# Patient Record
Sex: Female | Born: 1980 | Race: White | Hispanic: No | Smoking: Never smoker
Health system: Southern US, Community
[De-identification: ages and names within clinical notes are randomized; demographics above are authoritative.]

## PROBLEM LIST (undated history)

## (undated) DIAGNOSIS — I10 Essential (primary) hypertension: Secondary | ICD-10-CM

---

## 2013-03-05 ENCOUNTER — Emergency Department (HOSPITAL_COMMUNITY): Payer: BC Managed Care – PPO

## 2013-03-05 ENCOUNTER — Emergency Department (HOSPITAL_COMMUNITY)
Admission: EM | Admit: 2013-03-05 | Discharge: 2013-03-05 | Disposition: A | Discharge status: Home / Self Care | Payer: BC Managed Care – PPO | Attending: Emergency Medicine | Admitting: Emergency Medicine

## 2013-03-05 ENCOUNTER — Encounter (HOSPITAL_COMMUNITY): Payer: Self-pay | Admitting: Emergency Medicine

## 2013-03-05 DIAGNOSIS — R6889 Other general symptoms and signs: Secondary | ICD-10-CM | POA: Insufficient documentation

## 2013-03-05 DIAGNOSIS — R0989 Other specified symptoms and signs involving the circulatory and respiratory systems: Secondary | ICD-10-CM

## 2013-03-05 DIAGNOSIS — IMO0002 Reserved for concepts with insufficient information to code with codable children: Secondary | ICD-10-CM | POA: Insufficient documentation

## 2013-03-05 DIAGNOSIS — Z79899 Other long term (current) drug therapy: Secondary | ICD-10-CM | POA: Insufficient documentation

## 2013-03-05 DIAGNOSIS — Z888 Allergy status to other drugs, medicaments and biological substances status: Secondary | ICD-10-CM | POA: Insufficient documentation

## 2013-03-05 DIAGNOSIS — R110 Nausea: Secondary | ICD-10-CM | POA: Insufficient documentation

## 2013-03-05 DIAGNOSIS — I10 Essential (primary) hypertension: Secondary | ICD-10-CM

## 2013-03-05 HISTORY — DX: Essential (primary) hypertension: I10

## 2013-03-05 MED ORDER — PREDNISONE 20 MG PO TABS: ORAL_TABLET | ORAL | Status: AC

## 2013-03-05 MED ORDER — PREDNISONE 20 MG PO TABS
60.0000 mg | ORAL_TABLET | Freq: Once | ORAL | Status: AC
  Administered 2013-03-05: 60 mg via ORAL
  Filled 2013-03-05: qty 3

## 2013-03-05 MED ORDER — CLONIDINE HCL 0.1 MG PO TABS: 0.1000 mg | ORAL_TABLET | Freq: Two times a day (BID) | ORAL | Status: AC

## 2013-03-05 MED ORDER — CLONIDINE HCL 0.1 MG PO TABS
0.1000 mg | ORAL_TABLET | Freq: Once | ORAL | Status: AC
  Administered 2013-03-05: 0.1 mg via ORAL
  Filled 2013-03-05: qty 1

## 2013-03-05 NOTE — ED Notes (Signed)
Patient transported to X-ray 

## 2013-03-05 NOTE — ED Provider Notes (Signed)
CSN: [id number]     Arrival date & time 03/05/13  0036 History   First MD Initiated Contact with Patient 03/05/13 0121     Chief Complaint  Patient presents with  . feels like fb in throat    (Consider location/radiation/quality/duration/timing/severity/associated sxs/prior Treatment) HPI Comments: 33 yo female with htn hx presents with fb sensation in throat since two days prior, she started lisinopril on Thursday along with celexa.  No sob.  She tolerates po but feels different and mild nausea.  No fevers or sore throat.  No stridor.  No hx of similar.  No other exposures.   The history is provided by the patient.    Past Medical History  Diagnosis Date  . Hypertension    No past surgical history on file. No family history on file. History  Substance Use Topics  . Smoking status: Never Smoker   . Smokeless tobacco: Not on file  . Alcohol Use: No   OB History   Grav Para Term Preterm Abortions TAB SAB Ect Mult Living                 Review of Systems  Constitutional: Negative for fever and chills.  HENT: Positive for trouble swallowing. Negative for congestion.   Eyes: Negative for visual disturbance.  Respiratory: Negative for shortness of breath.   Cardiovascular: Negative for chest pain.  Gastrointestinal: Negative for vomiting and abdominal pain.  Genitourinary: Negative for dysuria and flank pain.  Musculoskeletal: Negative for back pain, neck pain and neck stiffness.  Skin: Negative for rash.  Neurological: Negative for light-headedness and headaches.    Allergies  Tramadol  Home Medications   Current Outpatient Rx  Name  Route  Sig  Dispense  Refill  . citalopram (CELEXA) 10 MG tablet   Oral   Take 10 mg by mouth daily.         . diphenhydrAMINE (BENADRYL) 25 MG tablet   Oral   Take 50 mg by mouth daily as needed (allergic reactions).         Marland Kitchen lisinopril-hydrochlorothiazide (PRINZIDE,ZESTORETIC) 10-12.5 MG per tablet   Oral   Take 1 tablet by  mouth daily.         . Mag Aspart-Potassium Aspart (POTASSIUM & MAGNESIUM ASPARTAT PO)   Oral   Take 1 tablet by mouth daily.         . Multiple Vitamins-Minerals (HAIR/SKIN/NAILS PO)   Oral   Take 1 tablet by mouth daily.          BP 173/118  Pulse 84  Temp(Src) 98.3 F (36.8 C) (Oral)  Resp 14  Ht 5\' 5"  (1.651 m)  Wt 213 lb (96.616 kg)  BMI 35.44 kg/m2  SpO2 99%  LMP 02/12/2013 Physical Exam  Nursing note and vitals reviewed. Constitutional: She is oriented to person, place, and time. She appears well-developed and well-nourished.  HENT:  Head: Normocephalic and atraumatic.  No obvious thyromegaly.  No trismus, uvular deviation, unilateral posterior pharyngeal edema or submandibular swelling. Supple neck full rom   Eyes: Conjunctivae are normal. Right eye exhibits no discharge. Left eye exhibits no discharge.  Neck: Normal range of motion. Neck supple. No tracheal deviation present.  Cardiovascular: Normal rate and regular rhythm.   Pulmonary/Chest: Effort normal and breath sounds normal.  Abdominal: Soft. She exhibits no distension. There is no tenderness. There is no guarding.  Musculoskeletal: She exhibits no edema.  Neurological: She is alert and oriented to person, place, and time.  Skin: Skin  is warm. No rash noted.  Psychiatric: She has a normal mood and affect.    ED Course  Procedures (including critical care time) Labs Review Labs Reviewed - No data to display Imaging Review Dg Neck Soft Tissue  03/05/2013   CLINICAL DATA:  Foreign body sensation.  EXAM: NECK SOFT TISSUES - 1+ VIEW  COMPARISON:  None available for comparison at time of study interpretation.  FINDINGS: There is no evidence of retropharyngeal soft tissue swelling, however the epiglottis is not seen in profile. The cervical airway is unremarkable and no radio-opaque foreign body identified.  IMPRESSION: Airway is patent, epiglottis is not outlined by air, and though this is unlikely to  reflect epiglottitis, direct inspection may be warranted.   Electronically Signed   By: Awilda Metroourtnay  Bloomer   On: 03/05/2013 02:39    EKG Interpretation   None       MDM   1. Foreign body sensation in throat   2. HTN (hypertension)    Concern for possible SE from lisinopril vs less likely other neck mass/ thyroid issue.   BP elevated, no cp or sob, recently started on medicines, likely from underlying htn with being in ED as well/ sxs. Xray soft tissue, po challenge. No stridor, tolerates po.  Discussed holding htn meds.  Clonidine in ED.  Pt improved on recheck but mild FB sensation lower throat (near thyroid region). No stridor, well appearing.  Discussed CT with radiology, they do not recommend based on xray as if acute inflammation or concern xray would show More findings. Strict reasons to return discussed.  Few days of po clonidine and steroids until outpt fup. Results and differential diagnosis were discussed with the patient. Close follow up outpatient was discussed, patient comfortable with the plan.   Filed Vitals:   03/05/13 0052 03/05/13 0129 03/05/13 0248  BP: 172/111 173/118 145/97  Pulse: 84 84 78  Temp: 97.7 F (36.5 C) 98.3 F (36.8 C)   TempSrc:  Oral   Resp: 20 14 14   Height: 5\' 5"  (1.651 m)    Weight: 213 lb (96.616 kg)    SpO2: 99% 99% 100%         Enid SkeensJoshua M Harm Jou, MD 03/05/13 [phone]

## 2013-03-05 NOTE — ED Notes (Signed)
The pt feels like there is something in her throat for 2 days.  She was just started on lisionopril and celexa  On Thursday.  She feels like she cannot breathe at  Night.  She feels like she cannot swallow but is able to swallow food and water and it stays down thus far

## 2013-03-05 NOTE — Discharge Instructions (Signed)
Stop taking lisinopril medicine. Take clonidine as directed only if blood pressure greater than 140 systolic (upper number) Prior to taking. See your doctor to change medications and recheck. If you develop shortness of breath, worsening symptoms, tongue swelling or other concerns Return to ER immediately.  If you were given medicines take as directed.  If you are on coumadin or contraceptives realize their levels and effectiveness is altered by many different medicines.  If you have any reaction (rash, tongues swelling, other) to the medicines stop taking and see a physician.   Please follow up as directed and return to the ER or see a physician for new or worsening symptoms.  Thank you.

## 2013-03-05 NOTE — ED Notes (Signed)
The pt took 50 mg benadryl 2 hours ago

## 2013-03-05 NOTE — ED Notes (Signed)
EDP updated pt. On results of test , discussed plan of care and discharge plan .

## 2013-03-21 ENCOUNTER — Ambulatory Visit: Payer: Self-pay | Admitting: Specialist

## 2013-03-21 LAB — CBC WITH DIFFERENTIAL/PLATELET
Basophil #: 0 10*3/uL (ref 0.0–0.1)
Basophil %: 0.5 %
EOS ABS: 0 10*3/uL (ref 0.0–0.7)
EOS PCT: 0.5 %
HCT: 42.4 % (ref 35.0–47.0)
HGB: 15 g/dL (ref 12.0–16.0)
Lymphocyte #: 1.8 10*3/uL (ref 1.0–3.6)
Lymphocyte %: 20.2 %
MCH: 30.3 pg (ref 26.0–34.0)
MCHC: 35.4 g/dL (ref 32.0–36.0)
MCV: 85 fL (ref 80–100)
MONO ABS: 0.7 x10 3/mm (ref 0.2–0.9)
Monocyte %: 7.8 %
NEUTROS ABS: 6.3 10*3/uL (ref 1.4–6.5)
NEUTROS PCT: 71 %
PLATELETS: 281 10*3/uL (ref 150–440)
RBC: 4.97 10*6/uL (ref 3.80–5.20)
RDW: 12.7 % (ref 11.5–14.5)
WBC: 8.9 10*3/uL (ref 3.6–11.0)

## 2013-03-21 LAB — COMPREHENSIVE METABOLIC PANEL
ALBUMIN: 3.8 g/dL (ref 3.4–5.0)
ALT: 16 U/L (ref 12–78)
Alkaline Phosphatase: 73 U/L
Anion Gap: 4 — ABNORMAL LOW (ref 7–16)
BILIRUBIN TOTAL: 0.6 mg/dL (ref 0.2–1.0)
BUN: 10 mg/dL (ref 7–18)
CHLORIDE: 100 mmol/L (ref 98–107)
Calcium, Total: 9.1 mg/dL (ref 8.5–10.1)
Co2: 32 mmol/L (ref 21–32)
Creatinine: 0.89 mg/dL (ref 0.60–1.30)
EGFR (Non-African Amer.): >60
Glucose: 92 mg/dL (ref 65–99)
Osmolality: 271 (ref 275–301)
Potassium: 2.6 mmol/L — ABNORMAL LOW (ref 3.5–5.1)
SGOT(AST): 22 U/L (ref 15–37)
SODIUM: 136 mmol/L (ref 136–145)
Total Protein: 7.6 g/dL (ref 6.4–8.2)

## 2013-03-21 LAB — TSH: THYROID STIMULATING HORM: 2.52 u[IU]/mL

## 2013-03-21 LAB — APTT: ACTIVATED PTT: 30.6 s (ref 23.6–35.9)

## 2013-03-21 LAB — IRON AND TIBC
IRON BIND. CAP.(TOTAL): 304 ug/dL (ref 250–450)
IRON SATURATION: 31 %
Iron: 94 ug/dL (ref 50–170)
UNBOUND IRON-BIND. CAP.: 210 ug/dL

## 2013-03-21 LAB — HEMOGLOBIN A1C: HEMOGLOBIN A1C: 5.5 % (ref 4.2–6.3)

## 2013-03-21 LAB — FERRITIN: FERRITIN (ARMC): 78 ng/mL (ref 8–388)

## 2013-03-21 LAB — LIPASE, BLOOD: Lipase: 148 U/L (ref 73–393)

## 2013-03-21 LAB — AMYLASE: Amylase: 61 U/L (ref 25–115)

## 2013-03-21 LAB — PHOSPHORUS: Phosphorus: 3 mg/dL (ref 2.5–4.9)

## 2013-03-21 LAB — FOLATE: FOLIC ACID: 15 ng/mL (ref 3.1–100.0)

## 2013-03-21 LAB — PROTIME-INR
INR: 1
Prothrombin Time: 12.9 secs (ref 11.5–14.7)

## 2013-03-21 LAB — MAGNESIUM: Magnesium: 1.7 mg/dL — ABNORMAL LOW

## 2013-03-21 LAB — BILIRUBIN, DIRECT: BILIRUBIN DIRECT: 0.1 mg/dL (ref 0.00–0.20)

## 2013-03-23 ENCOUNTER — Ambulatory Visit: Payer: Self-pay | Admitting: Specialist

## 2013-04-15 ENCOUNTER — Ambulatory Visit: Payer: Self-pay | Admitting: Specialist

## 2014-12-19 IMAGING — US ABDOMEN ULTRASOUND LIMITED
1 series · 14 of 25 positions shown · non-contrast
Comparison: None.

CLINICAL DATA: Snoring, morbid obesity

EXAM:
US ABDOMEN LIMITED - RIGHT UPPER QUADRANT

[Series 1: abdomen ultrasound limited · 0.31mm/px · 14 of 52 slices shown]
[im 1/52]
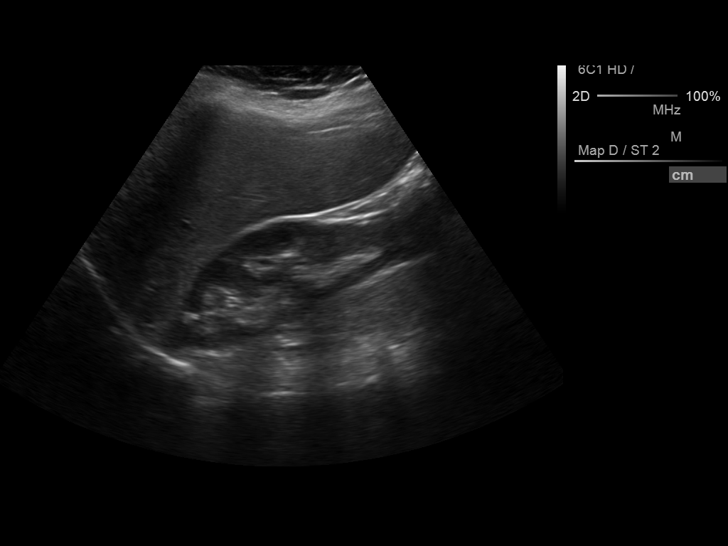
[im 5/52]
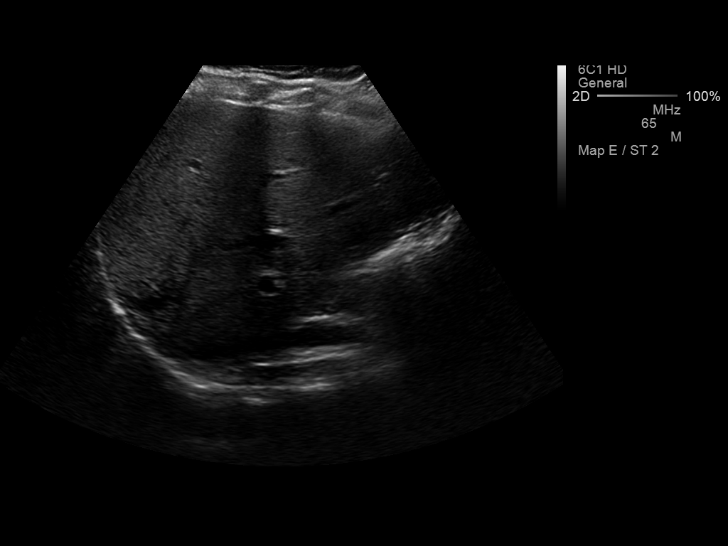
[im 9/52]
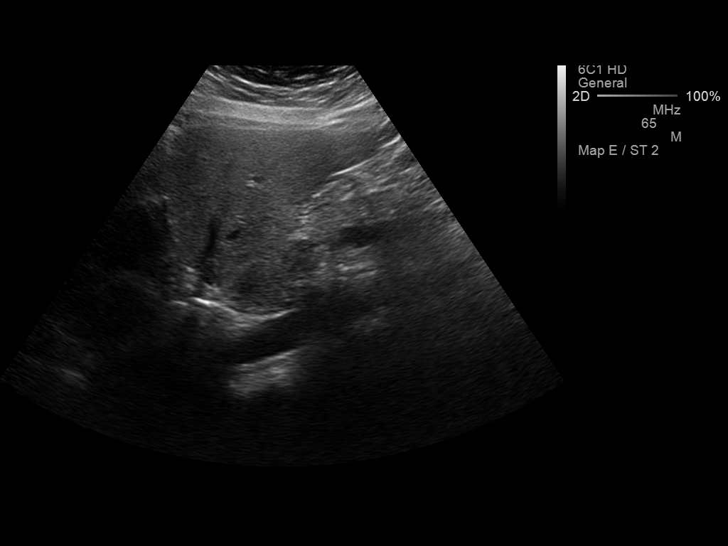
[im 13/52]
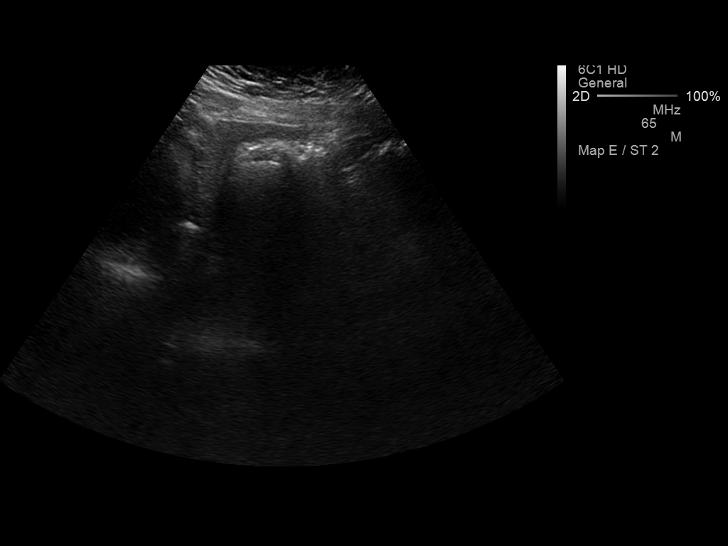
[im 18/52]
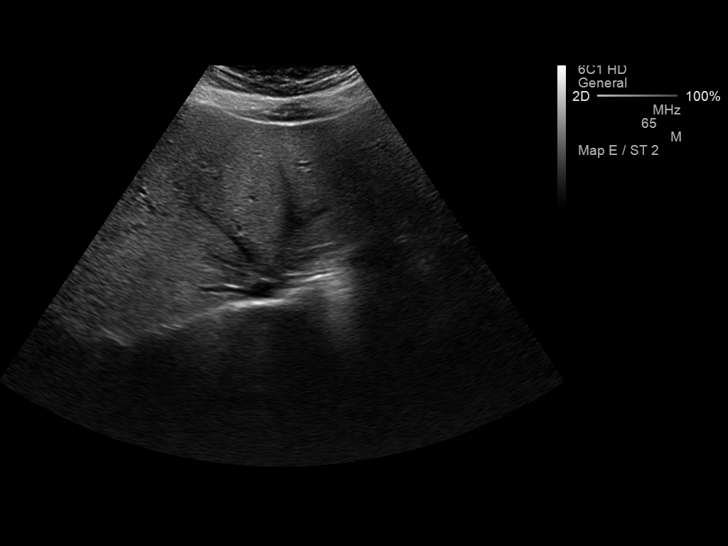
[im 20/52]
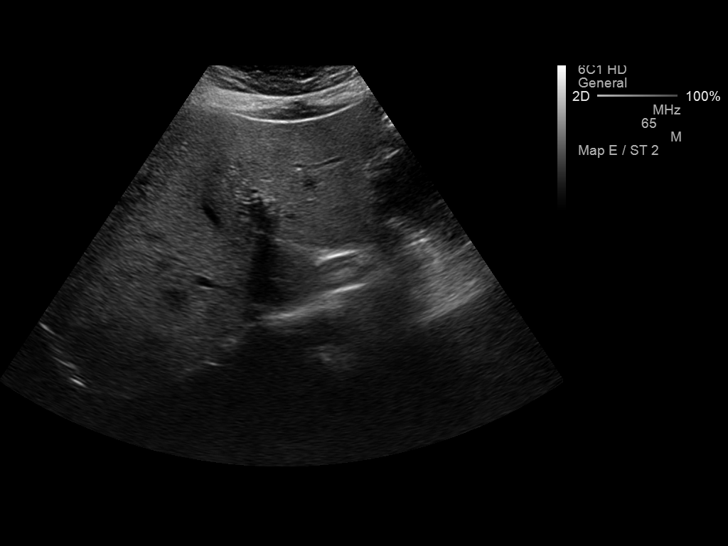
[im 24/52]
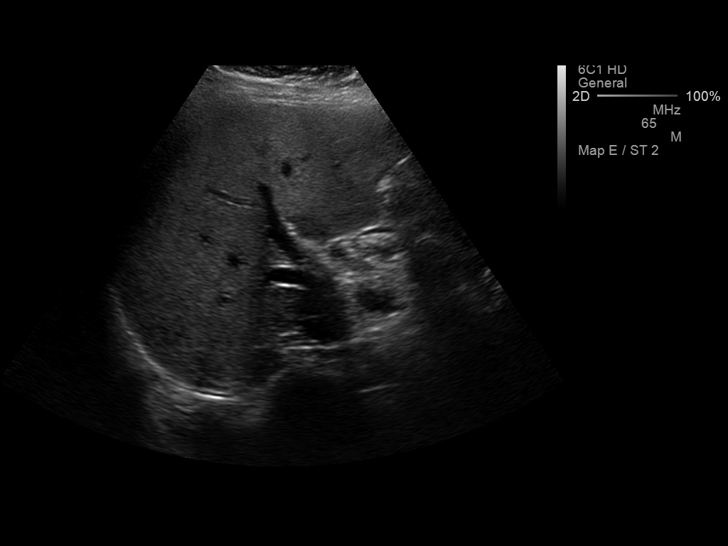
[im 28/52]
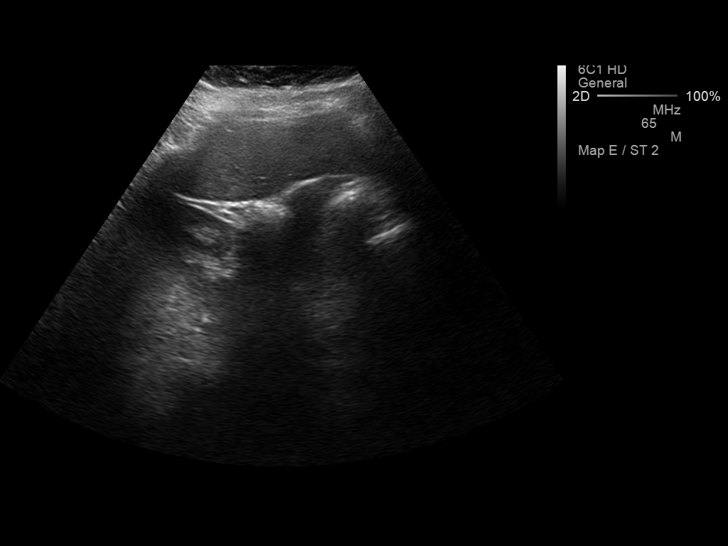
[im 32/52]
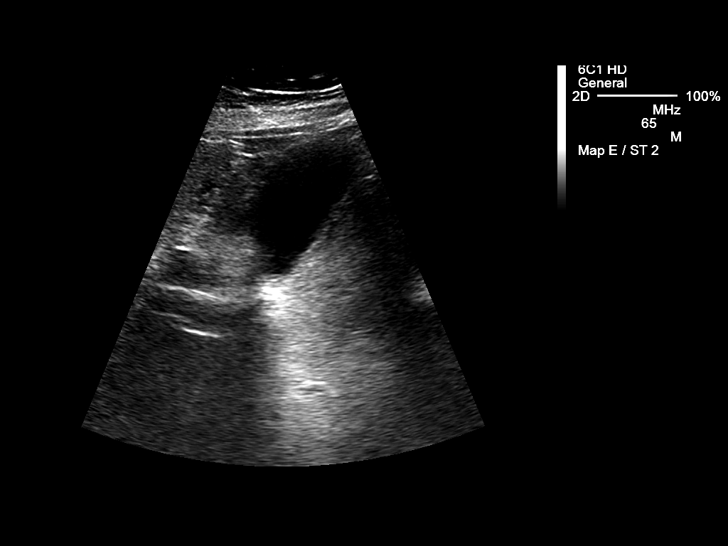
[im 35/52]
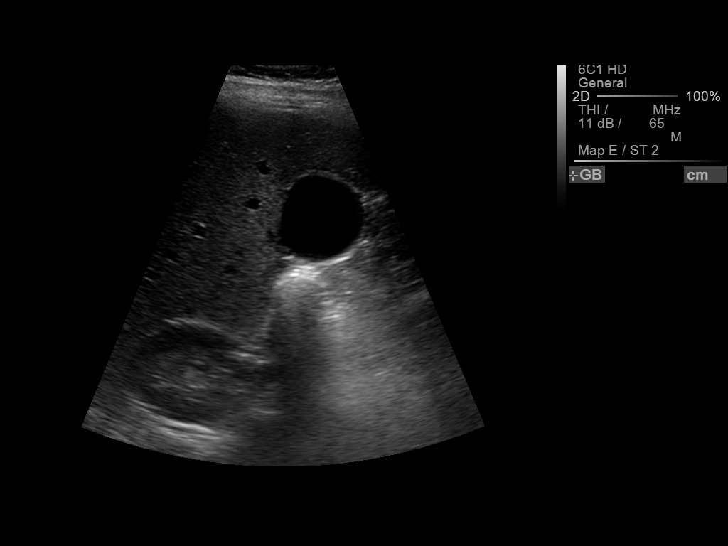
[im 39/52]
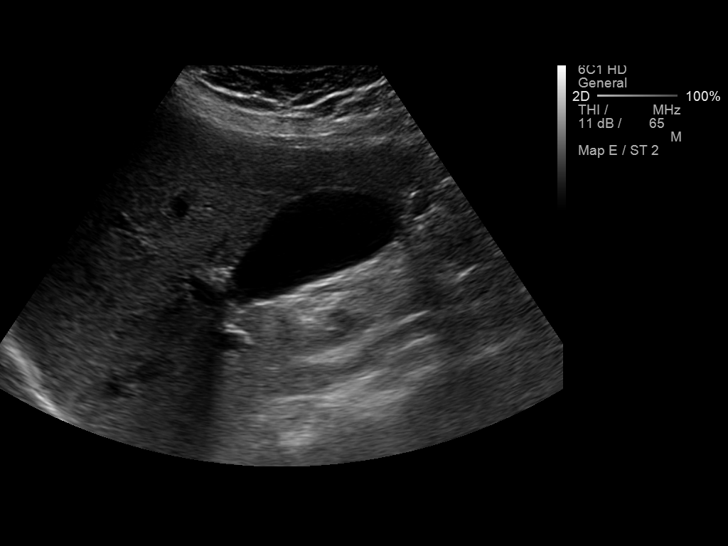
[im 43/52]
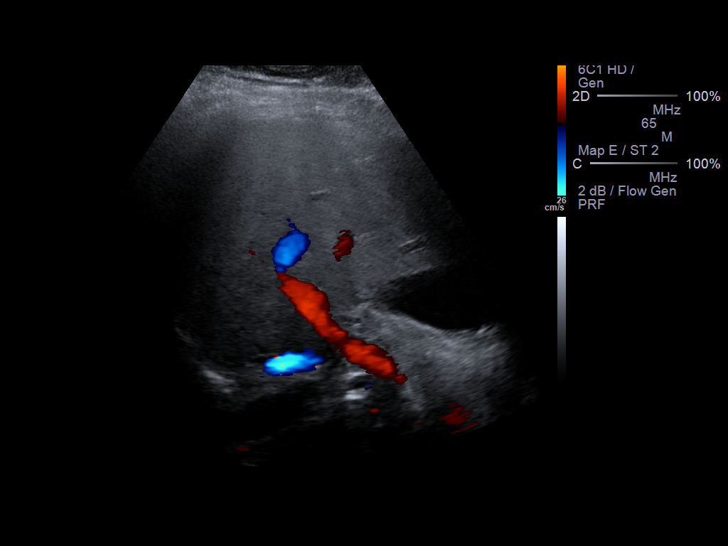
[im 47/52]
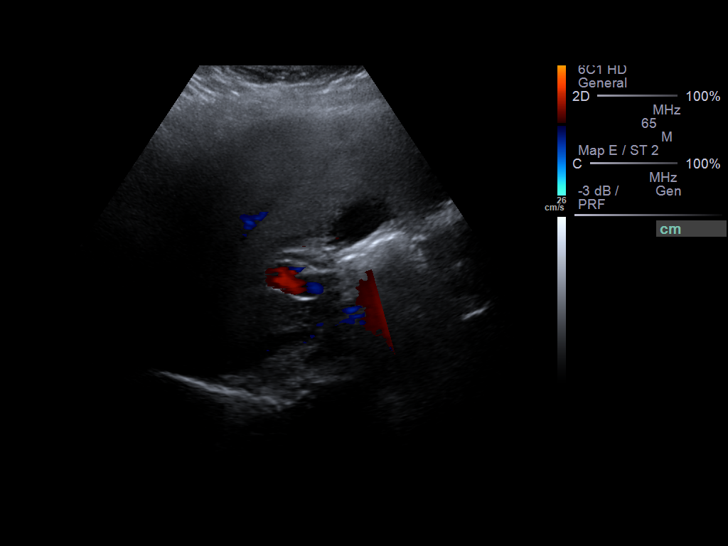
[im 52/52]
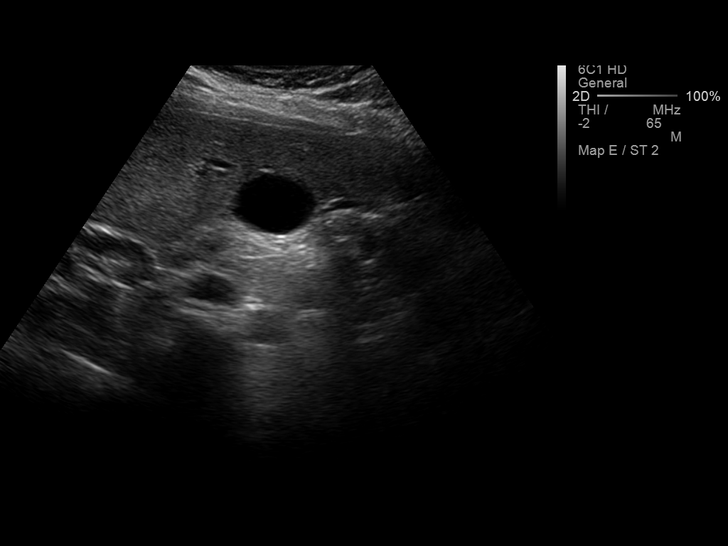

[14 of 25 positions shown; findings below may reference images not displayed]

FINDINGS: Gallbladder:

No gallstones or wall thickening visualized. No sonographic Murphy
sign noted.

Common bile duct:

Diameter: 4.5 mm

Liver:

No focal lesion identified. The liver is slightly increased in
echogenicity as can be seen with hepatic steatosis. .
IMPRESSION: 1. No cholelithiasis or sonographic evidence of acute cholecystitis.
2. Hepatic steatosis.
